# Patient Record
Sex: Female | Born: 1970 | Race: White | Hispanic: No | Marital: Single | Smoking: Never smoker
Health system: Southern US, Community
[De-identification: ages and names within clinical notes are randomized; demographics above are authoritative.]

## PROBLEM LIST (undated history)

## (undated) DIAGNOSIS — I10 Essential (primary) hypertension: Secondary | ICD-10-CM

## (undated) DIAGNOSIS — F419 Anxiety disorder, unspecified: Secondary | ICD-10-CM

---

## 2016-09-22 HISTORY — PX: FOOT SURGERY: SHX648

## 2017-01-13 DIAGNOSIS — S92325A Nondisplaced fracture of second metatarsal bone, left foot, initial encounter for closed fracture: Secondary | ICD-10-CM | POA: Insufficient documentation

## 2017-02-12 DIAGNOSIS — M85872 Other specified disorders of bone density and structure, left ankle and foot: Secondary | ICD-10-CM | POA: Diagnosis not present

## 2017-02-12 DIAGNOSIS — S92212D Displaced fracture of cuboid bone of left foot, subsequent encounter for fracture with routine healing: Secondary | ICD-10-CM | POA: Diagnosis not present

## 2017-02-12 DIAGNOSIS — S92302D Fracture of unspecified metatarsal bone(s), left foot, subsequent encounter for fracture with routine healing: Secondary | ICD-10-CM | POA: Diagnosis not present

## 2017-02-12 DIAGNOSIS — M79672 Pain in left foot: Secondary | ICD-10-CM | POA: Diagnosis not present

## 2017-02-25 DIAGNOSIS — G8929 Other chronic pain: Secondary | ICD-10-CM | POA: Diagnosis not present

## 2017-02-25 DIAGNOSIS — M79672 Pain in left foot: Secondary | ICD-10-CM | POA: Diagnosis not present

## 2017-03-03 DIAGNOSIS — S92325D Nondisplaced fracture of second metatarsal bone, left foot, subsequent encounter for fracture with routine healing: Secondary | ICD-10-CM | POA: Diagnosis not present

## 2017-03-30 DIAGNOSIS — G8929 Other chronic pain: Secondary | ICD-10-CM | POA: Diagnosis not present

## 2017-03-30 DIAGNOSIS — M79672 Pain in left foot: Secondary | ICD-10-CM | POA: Diagnosis not present

## 2017-04-07 ENCOUNTER — Telehealth: Payer: Self-pay | Admitting: *Deleted

## 2017-04-07 NOTE — Telephone Encounter (Signed)
Pt states she has been treated at [facility] for a fractured foot for 4 months, and would like to see another doctor for other options, PT is not helping.

## 2017-04-28 ENCOUNTER — Ambulatory Visit: Payer: Self-pay | Admitting: Sports Medicine

## 2017-07-22 DIAGNOSIS — Z23 Encounter for immunization: Secondary | ICD-10-CM | POA: Diagnosis not present

## 2017-07-22 DIAGNOSIS — I10 Essential (primary) hypertension: Secondary | ICD-10-CM | POA: Diagnosis not present

## 2017-07-22 DIAGNOSIS — Z1339 Encounter for screening examination for other mental health and behavioral disorders: Secondary | ICD-10-CM | POA: Diagnosis not present

## 2017-07-22 DIAGNOSIS — Z1331 Encounter for screening for depression: Secondary | ICD-10-CM | POA: Diagnosis not present

## 2017-11-04 DIAGNOSIS — S92009A Unspecified fracture of unspecified calcaneus, initial encounter for closed fracture: Secondary | ICD-10-CM | POA: Insufficient documentation

## 2018-03-24 ENCOUNTER — Other Ambulatory Visit: Payer: Self-pay | Admitting: Orthopedic Surgery

## 2018-04-26 ENCOUNTER — Encounter (HOSPITAL_BASED_OUTPATIENT_CLINIC_OR_DEPARTMENT_OTHER): Payer: Self-pay | Admitting: *Deleted

## 2018-04-26 ENCOUNTER — Other Ambulatory Visit: Payer: Self-pay

## 2018-04-29 ENCOUNTER — Ambulatory Visit (HOSPITAL_BASED_OUTPATIENT_CLINIC_OR_DEPARTMENT_OTHER): Payer: No Typology Code available for payment source | Admitting: Anesthesiology

## 2018-04-29 ENCOUNTER — Ambulatory Visit (HOSPITAL_BASED_OUTPATIENT_CLINIC_OR_DEPARTMENT_OTHER)
Admission: RE | Admit: 2018-04-29 | Discharge: 2018-04-29 | Disposition: A | Discharge status: Home / Self Care | Payer: No Typology Code available for payment source | Source: Ambulatory Visit | Attending: Orthopedic Surgery | Admitting: Orthopedic Surgery

## 2018-04-29 ENCOUNTER — Other Ambulatory Visit: Payer: Self-pay

## 2018-04-29 ENCOUNTER — Encounter (HOSPITAL_BASED_OUTPATIENT_CLINIC_OR_DEPARTMENT_OTHER)
Admission: RE | Disposition: A | Discharge status: Home / Self Care | Payer: Self-pay | Source: Ambulatory Visit | Attending: Orthopedic Surgery

## 2018-04-29 ENCOUNTER — Encounter (HOSPITAL_BASED_OUTPATIENT_CLINIC_OR_DEPARTMENT_OTHER): Payer: Self-pay

## 2018-04-29 DIAGNOSIS — Z79899 Other long term (current) drug therapy: Secondary | ICD-10-CM | POA: Diagnosis not present

## 2018-04-29 DIAGNOSIS — Y831 Surgical operation with implant of artificial internal device as the cause of abnormal reaction of the patient, or of later complication, without mention of misadventure at the time of the procedure: Secondary | ICD-10-CM | POA: Insufficient documentation

## 2018-04-29 DIAGNOSIS — T8484XA Pain due to internal orthopedic prosthetic devices, implants and grafts, initial encounter: Secondary | ICD-10-CM | POA: Insufficient documentation

## 2018-04-29 DIAGNOSIS — F419 Anxiety disorder, unspecified: Secondary | ICD-10-CM | POA: Insufficient documentation

## 2018-04-29 DIAGNOSIS — I10 Essential (primary) hypertension: Secondary | ICD-10-CM | POA: Insufficient documentation

## 2018-04-29 HISTORY — PX: HARDWARE REMOVAL: SHX979

## 2018-04-29 HISTORY — DX: Anxiety disorder, unspecified: F41.9

## 2018-04-29 HISTORY — DX: Essential (primary) hypertension: I10

## 2018-04-29 SURGERY — REMOVAL, HARDWARE
Anesthesia: Regional | Site: Foot | Laterality: Left

## 2018-04-29 MED ORDER — BUPIVACAINE-EPINEPHRINE (PF) 0.5% -1:200000 IJ SOLN
INTRAMUSCULAR | Status: DC | PRN
  Administered 2018-04-29: 26 mL via PERINEURAL

## 2018-04-29 MED ORDER — SODIUM CHLORIDE 0.9 % IJ SOLN
INTRAMUSCULAR | Status: AC
  Filled 2018-04-29: qty 20

## 2018-04-29 MED ORDER — ONDANSETRON HCL 4 MG/2ML IJ SOLN
INTRAMUSCULAR | Status: AC
  Filled 2018-04-29: qty 2

## 2018-04-29 MED ORDER — PROPOFOL 10 MG/ML IV BOLUS
INTRAVENOUS | Status: DC | PRN
  Administered 2018-04-29: 150 mg via INTRAVENOUS
  Administered 2018-04-29: 50 mg via INTRAVENOUS

## 2018-04-29 MED ORDER — PROPOFOL 10 MG/ML IV BOLUS
INTRAVENOUS | Status: AC
  Filled 2018-04-29: qty 20

## 2018-04-29 MED ORDER — MIDAZOLAM HCL 2 MG/2ML IJ SOLN
INTRAMUSCULAR | Status: AC
  Filled 2018-04-29: qty 2

## 2018-04-29 MED ORDER — ACETAMINOPHEN 160 MG/5ML PO SOLN: 325.0000 mg | ORAL | Status: DC | PRN

## 2018-04-29 MED ORDER — DOCUSATE SODIUM 100 MG PO CAPS: 100.0000 mg | ORAL_CAPSULE | Freq: Two times a day (BID) | ORAL | 0 refills | Status: DC

## 2018-04-29 MED ORDER — MIDAZOLAM HCL 2 MG/2ML IJ SOLN
INTRAMUSCULAR | Status: AC
Start: 2018-04-29 — End: ?
  Filled 2018-04-29: qty 2

## 2018-04-29 MED ORDER — ONDANSETRON HCL 4 MG/2ML IJ SOLN
INTRAMUSCULAR | Status: DC | PRN
  Administered 2018-04-29: 4 mg via INTRAVENOUS

## 2018-04-29 MED ORDER — FENTANYL CITRATE (PF) 100 MCG/2ML IJ SOLN
INTRAMUSCULAR | Status: AC
  Filled 2018-04-29: qty 2

## 2018-04-29 MED ORDER — OXYCODONE HCL 5 MG PO TABS: 5.0000 mg | ORAL_TABLET | Freq: Once | ORAL | Status: DC | PRN

## 2018-04-29 MED ORDER — CEFAZOLIN SODIUM-DEXTROSE 2-4 GM/100ML-% IV SOLN
INTRAVENOUS | Status: AC
  Filled 2018-04-29: qty 100

## 2018-04-29 MED ORDER — SODIUM CHLORIDE 0.9 % IV SOLN: INTRAVENOUS | Status: DC

## 2018-04-29 MED ORDER — DEXAMETHASONE SODIUM PHOSPHATE 10 MG/ML IJ SOLN
INTRAMUSCULAR | Status: AC
  Filled 2018-04-29: qty 1

## 2018-04-29 MED ORDER — CEFAZOLIN SODIUM-DEXTROSE 2-4 GM/100ML-% IV SOLN
2.0000 g | INTRAVENOUS | Status: AC
  Administered 2018-04-29: 2 g via INTRAVENOUS

## 2018-04-29 MED ORDER — BUPIVACAINE LIPOSOME 1.3 % IJ SUSP
INTRAMUSCULAR | Status: DC | PRN
  Administered 2018-04-29: 13 mL

## 2018-04-29 MED ORDER — LACTATED RINGERS IV SOLN
INTRAVENOUS | Status: DC
  Administered 2018-04-29: 08:00:00 via INTRAVENOUS

## 2018-04-29 MED ORDER — 0.9 % SODIUM CHLORIDE (POUR BTL) OPTIME
TOPICAL | Status: DC | PRN
  Administered 2018-04-29: 400 mL

## 2018-04-29 MED ORDER — DEXAMETHASONE SODIUM PHOSPHATE 4 MG/ML IJ SOLN
INTRAMUSCULAR | Status: DC | PRN
  Administered 2018-04-29: 10 mg via INTRAVENOUS

## 2018-04-29 MED ORDER — CLONIDINE HCL (ANALGESIA) 100 MCG/ML EP SOLN
EPIDURAL | Status: DC | PRN
  Administered 2018-04-29: 80 ug

## 2018-04-29 MED ORDER — OXYCODONE HCL 5 MG PO TABS: 5.0000 mg | ORAL_TABLET | ORAL | 0 refills | Status: DC | PRN

## 2018-04-29 MED ORDER — FENTANYL CITRATE (PF) 100 MCG/2ML IJ SOLN: 25.0000 ug | INTRAMUSCULAR | Status: DC | PRN

## 2018-04-29 MED ORDER — LIDOCAINE 2% (20 MG/ML) 5 ML SYRINGE
INTRAMUSCULAR | Status: AC
  Filled 2018-04-29: qty 5

## 2018-04-29 MED ORDER — SENNA 8.6 MG PO TABS: 2.0000 | ORAL_TABLET | Freq: Two times a day (BID) | ORAL | 0 refills | Status: DC

## 2018-04-29 MED ORDER — FENTANYL CITRATE (PF) 100 MCG/2ML IJ SOLN
50.0000 ug | INTRAMUSCULAR | Status: AC | PRN
  Administered 2018-04-29 (×2): 25 ug via INTRAVENOUS
  Administered 2018-04-29: 50 ug via INTRAVENOUS

## 2018-04-29 MED ORDER — SCOPOLAMINE 1 MG/3DAYS TD PT72: 1.0000 | MEDICATED_PATCH | Freq: Once | TRANSDERMAL | Status: DC | PRN

## 2018-04-29 MED ORDER — CHLORHEXIDINE GLUCONATE 4 % EX LIQD: 60.0000 mL | Freq: Once | CUTANEOUS | Status: DC

## 2018-04-29 MED ORDER — OXYCODONE HCL 5 MG/5ML PO SOLN: 5.0000 mg | Freq: Once | ORAL | Status: DC | PRN

## 2018-04-29 MED ORDER — ACETAMINOPHEN 325 MG PO TABS: 325.0000 mg | ORAL_TABLET | ORAL | Status: DC | PRN

## 2018-04-29 MED ORDER — MIDAZOLAM HCL 2 MG/2ML IJ SOLN
1.0000 mg | INTRAMUSCULAR | Status: DC | PRN
  Administered 2018-04-29 (×2): 2 mg via INTRAVENOUS

## 2018-04-29 MED ORDER — BUPIVACAINE LIPOSOME 1.3 % IJ SUSP
INTRAMUSCULAR | Status: AC
  Filled 2018-04-29: qty 20

## 2018-04-29 SURGICAL SUPPLY — 65 items
BANDAGE ACE 4X5 VEL STRL LF (GAUZE/BANDAGES/DRESSINGS) IMPLANT
BANDAGE ESMARK 6X9 LF (GAUZE/BANDAGES/DRESSINGS) IMPLANT
BENZOIN TINCTURE PRP APPL 2/3 (GAUZE/BANDAGES/DRESSINGS) IMPLANT
BLADE SURG 15 STRL LF DISP TIS (BLADE) ×3 IMPLANT
BLADE SURG 15 STRL SS (BLADE) ×6
BNDG COHESIVE 4X5 TAN STRL (GAUZE/BANDAGES/DRESSINGS) IMPLANT
BNDG COHESIVE 6X5 TAN STRL LF (GAUZE/BANDAGES/DRESSINGS) IMPLANT
BNDG ESMARK 4X9 LF (GAUZE/BANDAGES/DRESSINGS) IMPLANT
BNDG ESMARK 6X9 LF (GAUZE/BANDAGES/DRESSINGS)
CHLORAPREP W/TINT 26ML (MISCELLANEOUS) ×3 IMPLANT
CLOSURE WOUND 1/2 X4 (GAUZE/BANDAGES/DRESSINGS)
COVER BACK TABLE 60X90IN (DRAPES) ×3 IMPLANT
CUFF TOURNIQUET SINGLE 34IN LL (TOURNIQUET CUFF) IMPLANT
DECANTER SPIKE VIAL GLASS SM (MISCELLANEOUS) IMPLANT
DRAPE EXTREMITY T 121X128X90 (DRAPE) ×3 IMPLANT
DRAPE OEC MINIVIEW 54X84 (DRAPES) ×3 IMPLANT
DRAPE SURG 17X23 STRL (DRAPES) IMPLANT
DRAPE U-SHAPE 47X51 STRL (DRAPES) ×3 IMPLANT
DRSG MEPITEL 4X7.2 (GAUZE/BANDAGES/DRESSINGS) ×3 IMPLANT
DRSG PAD ABDOMINAL 8X10 ST (GAUZE/BANDAGES/DRESSINGS) ×3 IMPLANT
ELECT REM PT RETURN 9FT ADLT (ELECTROSURGICAL) ×3
ELECTRODE REM PT RTRN 9FT ADLT (ELECTROSURGICAL) ×1 IMPLANT
GAUZE SPONGE 4X4 12PLY STRL (GAUZE/BANDAGES/DRESSINGS) ×3 IMPLANT
GLOVE BIO SURGEON STRL SZ8 (GLOVE) ×3 IMPLANT
GLOVE BIOGEL PI IND STRL 7.0 (GLOVE) ×1 IMPLANT
GLOVE BIOGEL PI IND STRL 7.5 (GLOVE) ×1 IMPLANT
GLOVE BIOGEL PI IND STRL 8 (GLOVE) ×2 IMPLANT
GLOVE BIOGEL PI INDICATOR 7.0 (GLOVE) ×2
GLOVE BIOGEL PI INDICATOR 7.5 (GLOVE) ×2
GLOVE BIOGEL PI INDICATOR 8 (GLOVE) ×4
GLOVE ECLIPSE 6.5 STRL STRAW (GLOVE) ×3 IMPLANT
GLOVE ECLIPSE 8.0 STRL XLNG CF (GLOVE) ×3 IMPLANT
GOWN STRL REUS W/ TWL LRG LVL3 (GOWN DISPOSABLE) ×1 IMPLANT
GOWN STRL REUS W/ TWL XL LVL3 (GOWN DISPOSABLE) ×2 IMPLANT
GOWN STRL REUS W/TWL LRG LVL3 (GOWN DISPOSABLE) ×2
GOWN STRL REUS W/TWL XL LVL3 (GOWN DISPOSABLE) ×4
NEEDLE HYPO 22GX1.5 SAFETY (NEEDLE) IMPLANT
PACK BASIN DAY SURGERY FS (CUSTOM PROCEDURE TRAY) ×3 IMPLANT
PAD CAST 4YDX4 CTTN HI CHSV (CAST SUPPLIES) ×1 IMPLANT
PADDING CAST COTTON 4X4 STRL (CAST SUPPLIES) ×2
PADDING CAST COTTON 6X4 STRL (CAST SUPPLIES) IMPLANT
PENCIL BUTTON HOLSTER BLD 10FT (ELECTRODE) ×3 IMPLANT
SANITIZER HAND PURELL 535ML FO (MISCELLANEOUS) ×3 IMPLANT
SHEET MEDIUM DRAPE 40X70 STRL (DRAPES) ×3 IMPLANT
SLEEVE SCD COMPRESS KNEE MED (MISCELLANEOUS) ×3 IMPLANT
SPLINT FAST PLASTER 5X30 (CAST SUPPLIES)
SPLINT PLASTER CAST FAST 5X30 (CAST SUPPLIES) IMPLANT
SPONGE LAP 18X18 RF (DISPOSABLE) ×3 IMPLANT
STOCKINETTE 6  STRL (DRAPES) ×2
STOCKINETTE 6 STRL (DRAPES) ×1 IMPLANT
STRIP CLOSURE SKIN 1/2X4 (GAUZE/BANDAGES/DRESSINGS) IMPLANT
SUCTION FRAZIER HANDLE 10FR (MISCELLANEOUS)
SUCTION TUBE FRAZIER 10FR DISP (MISCELLANEOUS) IMPLANT
SUT ETHILON 3 0 PS 1 (SUTURE) ×3 IMPLANT
SUT MNCRL AB 3-0 PS2 18 (SUTURE) ×3 IMPLANT
SUT VIC AB 0 SH 27 (SUTURE) IMPLANT
SUT VIC AB 2-0 SH 18 (SUTURE) ×3 IMPLANT
SUT VIC AB 2-0 SH 27 (SUTURE)
SUT VIC AB 2-0 SH 27XBRD (SUTURE) IMPLANT
SYR BULB 3OZ (MISCELLANEOUS) ×3 IMPLANT
SYR CONTROL 10ML LL (SYRINGE) ×3 IMPLANT
TOWEL GREEN STERILE FF (TOWEL DISPOSABLE) ×3 IMPLANT
TUBE CONNECTING 20'X1/4 (TUBING)
TUBE CONNECTING 20X1/4 (TUBING) IMPLANT
UNDERPAD 30X30 (UNDERPADS AND DIAPERS) ×3 IMPLANT

## 2018-04-29 NOTE — Progress Notes (Signed)
Assisted Dr. Moser with left, ultrasound guided, popliteal block. Side rails up, monitors on throughout procedure. See vital signs in flow sheet. Tolerated Procedure well. 

## 2018-04-29 NOTE — Anesthesia Procedure Notes (Signed)
Anesthesia Regional Block: Popliteal block   Pre-Anesthetic Checklist: ,, timeout performed, Correct Patient, Correct Site, Correct Laterality, Correct Procedure, Correct Position, site marked, Risks and benefits discussed,  Surgical consent,  Pre-op evaluation,  At surgeon's request and post-op pain management  Laterality: Lower and Left  Prep: chloraprep       Needles:  Injection technique: Single-shot  Needle Type: Echogenic Stimulator Needle          Additional Needles:   Procedures:,,,, ultrasound used (permanent image in chart),,,,  Narrative:  Start time: 04/29/2018 8:33 AM End time: 04/29/2018 8:37 AM Injection made incrementally with aspirations every 5 mL.  Performed by: Personally  Anesthesiologist: Val EagleMoser, Cambria Osten, MD  Additional Notes: H+P and labs reviewed, risks and benefits discussed with patient, procedure tolerated well without complications

## 2018-04-29 NOTE — H&P (Signed)
Sarah Wiggins is an 47 y.o. female.   Chief Complaint: left foot pain HPI: The patient is a 47 year old female with past medical history significant for a left calcaneus fracture.  She continues to have left foot pain after open treatment of a calcaneus fracture with internal fixation.  She has failed nonoperative treatment to date including activity modification, oral anti-inflammatories and shoewear modification as well as physical therapy.  She presents now for removal of deep implants from the lateral aspect of the left calcaneus.  Past Medical History:  Diagnosis Date  . Anxiety   . Hypertension     Past Surgical History:  Procedure Laterality Date  . FOOT SURGERY  2018    History reviewed. No pertinent family history. Social History:  reports that she has never smoked. She has never used smokeless tobacco. She reports that she drank alcohol. She reports that she does not use drugs.  Allergies: No Known Allergies  Medications Prior to Admission  Medication Sig Dispense Refill  . clonazePAM (KLONOPIN) 2 MG tablet Take 2 mg by mouth daily.    Marland Kitchen. losartan (COZAAR) 50 MG tablet Take 50 mg by mouth.    . zolpidem (AMBIEN) 10 MG tablet Take 10 mg by mouth at bedtime as needed for sleep.    . metaxalone (SKELAXIN) 800 MG tablet Take 800 mg by mouth 3 (three) times daily.      No results found for this or any previous visit (from the past 48 hour(s)). No results found.  ROS no recent fever, chills, nausea, vomiting or changes in her appetite  Blood pressure 120/81, pulse 81, temperature 97.9 F (36.6 C), temperature source Oral, resp. rate 19, height 5\' 4"  (1.626 m), weight 53.8 kg, last menstrual period 04/28/2018, SpO2 100 %. Physical Exam  Well-nourished well-developed woman in no apparent distress.  Alert and oriented x4.  Mood and affect are normal.  Extraocular motions are intact.  Respirations are unlabored.  Gait is normal.  The left foot has a healed surgical incision at the  lateral hindfoot.  Skin is otherwise healthy and intact.  No lymphadenopathy.  Pulses are palpable.  Diminished sensibility light touch in the sural nerve distribution along the inferior corner of the incision.  5 out of 5 strength in plantar flexion, dorsi flexion, inversion and eversion around the ankle.  Decreased range of motion of the subtalar joint and inversion and eversion.  Assessment/Plan Painful hardware left calcaneus - to OR for removal of deep implants.  The risks and benefits of the alternative treatment options have been discussed in detail.  The patient wishes to proceed with surgery and specifically understands risks of bleeding, infection, nerve damage, blood clots, need for additional surgery, amputation and death.   Toni ArthursHEWITT, Mieshia Pepitone, MD 04/29/2018, 8:57 AM

## 2018-04-29 NOTE — Op Note (Signed)
04/29/2018  10:09 AM  PATIENT:  Sarah GreenJamie A Wiggins  47 y.o. female  PRE-OPERATIVE DIAGNOSIS:  Left calcaneus painful hardware  POST-OPERATIVE DIAGNOSIS:  Left calcaneus painful hardware  Procedure(s): 1.  Removal of deep implants left foot 2.  Left foot lateral and Harris heel xrays  SURGEON:  Toni ArthursJohn Tennile Styles, MD  ASSISTANT: Alfredo MartinezJustin Ollis, PA-C  ANESTHESIA:   General, regional  EBL:  minimal   TOURNIQUET:   Total Tourniquet Time Documented: Thigh (Left) - 36 minutes Total: Thigh (Left) - 36 minutes  COMPLICATIONS:  None apparent  DISPOSITION:  Extubated, awake and stable to recovery.  INDICATION FOR PROCEDURE: The patient is a 47 year old woman who is almost a year out now from open treatment of her left calcaneus fracture with internal fixation.  She complains of pain at the lateral hindfoot.  She has retained hardware in this area.  She has failed nonoperative treatment to date including activity modification, oral anti-inflammatories, shoewear modification and extensive physical therapy.  She presents now for removal of deep implants from the left calcaneus.  The risks and benefits of the alternative treatment options have been discussed in detail.  The patient wishes to proceed with surgery and specifically understands risks of bleeding, infection, nerve damage, blood clots, need for additional surgery, amputation and death.  PROCEDURE IN DETAIL:  After pre operative consent was obtained, and the correct operative site was identified, the patient was brought to the operating room and placed supine on the OR table.  Anesthesia was administered.  Pre-operative antibiotics were administered.  A surgical timeout was taken.  The left lower extremity was prepped and draped in standard sterile fashion with a tourniquet around the thigh.  The extremity was elevated and the tourniquet was inflated to 250 mmHg.  Patient's previous lateral hindfoot incision was identified.  The incision was opened  again sharply and dissection was carried down through the subcutaneous tissues to the level of the plate.  A full-thickness lateral flap was then elevated from the lateral wall the calcaneus exposing the plate appropriately.  The visualized portions of the peroneal tendons appeared healthy.  The screws around the perimeter of the plate were removed from the level of the posterior facet around to the plantar aspect of the tuberosity.  The anterior inferior screw was removed through the lateral incision.  A small stab incision was made to access the anterior superior screw.  This was removed without difficulty.  The plate was then elevated from the lateral wall of the calcaneus and removed in its entirety.  Lateral and Harris heel radiographs showed appropriate removal of all metallic hardware in appropriate alignment of the calcaneus.  The fractures appear radiographically healed.  The wound was then irrigated copiously.  Inverted simple sutures of 2-0 Vicryl were used to close the deep subtenons tissues.  Algower-Donati sutures were used to close the skin incision.  20 cc of Exparel were infiltrated into the subcutaneous tissues around the incision.  Sterile dressings were applied followed by a well-padded compression wrap.  Tourniquet was released after application of the dressings.  The patient was awakened from anesthesia and transported to the recovery room in stable condition.   FOLLOW UP PLAN: The patient may bear weight as tolerated in the cam boot.  Follow-up with me in the office in 2 weeks for suture removal.  Aleve, extra strength Tylenol and oxycodone for pain control.   RADIOGRAPHS: Lateral and Harris heel radiographs of the left foot are obtained intraoperatively.  These show interval  removal of all metallic hardware from the left calcaneus.  Fractures appear radiographically healed.  Alignment of the calcaneus is appropriate.    Alfredo Martinez PA-C was present and scrubbed for the duration  of the operative case. His assistance was essential in positioning the patient, prepping and draping, gaining and maintaining exposure, performing the operation, closing and dressing the wounds and applying the splint.

## 2018-04-29 NOTE — Anesthesia Procedure Notes (Signed)
Procedure Name: LMA Insertion Date/Time: 04/29/2018 9:11 AM Performed by: Gar GibbonKeeton, Efrat Zuidema S, CRNA Pre-anesthesia Checklist: Patient identified, Emergency Drugs available, Suction available and Patient being monitored Patient Re-evaluated:Patient Re-evaluated prior to induction Oxygen Delivery Method: Circle system utilized Preoxygenation: Pre-oxygenation with 100% oxygen Induction Type: IV induction Ventilation: Mask ventilation without difficulty LMA: LMA inserted LMA Size: 4.0 Number of attempts: 1 Airway Equipment and Method: Bite block Placement Confirmation: positive ETCO2 Tube secured with: Tape Dental Injury: Teeth and Oropharynx as per pre-operative assessment

## 2018-04-29 NOTE — Discharge Instructions (Signed)
Sarah ArthursJohn Hewitt, MD [facility]  Please read the following information regarding your care after surgery.  Medications  You only need a prescription for the narcotic pain medicine (ex. oxycodone, Percocet, Norco).  All of the other medicines listed below are available over the counter. X Aleve 2 pills twice a day for the first 3 days after surgery. X acetominophen (Tylenol) 650 mg every 4-6 hours as you need for minor to moderate pain X oxycodone as prescribed for severe pain  Narcotic pain medicine (ex. oxycodone, Percocet, Vicodin) will cause constipation.  To prevent this problem, take the following medicines while you are taking any pain medicine. X docusate sodium (Colace) 100 mg twice a day X senna (Senokot) 2 tablets twice a day  Weight Bearing X Bear weight only on your operated foot in the CAM boot.  Cast / Splint / Dressing X Keep your splint, cast or dressing clean and dry.  Dont put anything (coat hanger, pencil, etc) down inside of it.  If it gets damp, use a hair dryer on the cool setting to dry it.  If it gets soaked, call the office to schedule an appointment for a cast change.  Do not remove the CAM boot.   After your dressing, cast or splint is removed; you may shower, but do not soak or scrub the wound.  Allow the water to run over it, and then gently pat it dry.  Swelling It is normal for you to have swelling where you had surgery.  To reduce swelling and pain, keep your toes above your nose for at least 3 days after surgery.  It may be necessary to keep your foot or leg elevated for several weeks.  If it hurts, it should be elevated.  Follow Up Call my office at [phone] when you are discharged from the hospital or surgery center to schedule an appointment to be seen two weeks after surgery.  Call my office at [phone] if you develop a fever >101.5 F, nausea, vomiting, bleeding from the surgical site or severe pain.

## 2018-04-29 NOTE — Anesthesia Preprocedure Evaluation (Signed)
Anesthesia Evaluation  Patient identified by MRN, date of birth, ID band Patient awake    Reviewed: Allergy & Precautions, NPO status , Patient's Chart, lab work & pertinent test results  History of Anesthesia Complications Negative for: history of anesthetic complications  Airway Mallampati: II  TM Distance: >3 FB Neck ROM: Full    Dental  (+) Teeth Intact   Pulmonary neg pulmonary ROS,    breath sounds clear to auscultation       Cardiovascular hypertension, Pt. on medications  Rhythm:Regular     Neuro/Psych PSYCHIATRIC DISORDERS Anxiety negative neurological ROS     GI/Hepatic negative GI ROS, Neg liver ROS,   Endo/Other  negative endocrine ROS  Renal/GU negative Renal ROS     Musculoskeletal   Abdominal   Peds  Hematology negative hematology ROS (+)   Anesthesia Other Findings   Reproductive/Obstetrics IUD                             Anesthesia Physical Anesthesia Plan  ASA: II  Anesthesia Plan: General and Regional   Post-op Pain Management:  Regional for Post-op pain   Induction: Intravenous  PONV Risk Score and Plan: 3 and Ondansetron and Dexamethasone  Airway Management Planned: LMA  Additional Equipment: None  Intra-op Plan:   Post-operative Plan: Extubation in OR  Informed Consent: I have reviewed the patients History and Physical, chart, labs and discussed the procedure including the risks, benefits and alternatives for the proposed anesthesia with the patient or authorized representative who has indicated his/her understanding and acceptance.   Dental advisory given  Plan Discussed with: CRNA and Surgeon  Anesthesia Plan Comments:         Anesthesia Quick Evaluation

## 2018-04-29 NOTE — Transfer of Care (Signed)
Immediate Anesthesia Transfer of Care Note  Patient: Sarah Wiggins  Procedure(s) Performed: Removal of deep implants left foot (Left Foot)  Patient Location: PACU  Anesthesia Type:GA combined with regional for post-op pain  Level of Consciousness: sedated and responds to stimulation  Airway & Oxygen Therapy: Patient Spontanous Breathing and Patient connected to face mask oxygen  Post-op Assessment: Report given to RN and Post -op Vital signs reviewed and stable  Post vital signs: Reviewed and stable  Last Vitals:  Vitals Value Taken Time  BP 90/54 04/29/2018 10:07 AM  Temp    Pulse 77 04/29/2018 10:08 AM  Resp 10 04/29/2018 10:08 AM  SpO2 100 % 04/29/2018 10:08 AM  Vitals shown include unvalidated device data.  Last Pain:  Vitals:   04/29/18 0751  TempSrc: Oral         Complications: No apparent anesthesia complications

## 2018-04-30 ENCOUNTER — Encounter (HOSPITAL_BASED_OUTPATIENT_CLINIC_OR_DEPARTMENT_OTHER): Payer: Self-pay | Admitting: Orthopedic Surgery

## 2018-04-30 NOTE — Anesthesia Postprocedure Evaluation (Signed)
Anesthesia Post Note  Patient: Sarah Wiggins  Procedure(s) Performed: Removal of deep implants left foot (Left Foot)     Patient location during evaluation: PACU Anesthesia Type: Regional and General Level of consciousness: awake and alert Pain management: pain level controlled Vital Signs Assessment: post-procedure vital signs reviewed and stable Respiratory status: spontaneous breathing, nonlabored ventilation, respiratory function stable and patient connected to nasal cannula oxygen Cardiovascular status: blood pressure returned to baseline and stable Postop Assessment: no apparent nausea or vomiting Anesthetic complications: no    Last Vitals:  Vitals:   04/29/18 1100 04/29/18 1105  BP:  130/87  Pulse: 65 72  Resp: 12 12  Temp:  36.6 C  SpO2: 100% 100%    Last Pain:  Vitals:   04/29/18 1105  TempSrc: Oral  PainSc: 0-No pain                 Jules Vidovich

## 2018-09-02 DIAGNOSIS — H02419 Mechanical ptosis of unspecified eyelid: Secondary | ICD-10-CM | POA: Insufficient documentation

## 2018-09-02 DIAGNOSIS — H5340 Unspecified visual field defects: Secondary | ICD-10-CM | POA: Insufficient documentation

## 2018-11-08 DIAGNOSIS — Z111 Encounter for screening for respiratory tuberculosis: Secondary | ICD-10-CM | POA: Diagnosis not present

## 2018-11-08 DIAGNOSIS — Z0184 Encounter for antibody response examination: Secondary | ICD-10-CM | POA: Diagnosis not present

## 2018-11-19 DIAGNOSIS — Z111 Encounter for screening for respiratory tuberculosis: Secondary | ICD-10-CM | POA: Diagnosis not present

## 2019-02-01 DIAGNOSIS — Z01818 Encounter for other preprocedural examination: Secondary | ICD-10-CM | POA: Diagnosis not present

## 2019-02-01 DIAGNOSIS — Z1159 Encounter for screening for other viral diseases: Secondary | ICD-10-CM | POA: Diagnosis not present

## 2019-02-04 DIAGNOSIS — H53453 Other localized visual field defect, bilateral: Secondary | ICD-10-CM | POA: Diagnosis not present

## 2019-02-04 DIAGNOSIS — H02411 Mechanical ptosis of right eyelid: Secondary | ICD-10-CM | POA: Diagnosis not present

## 2019-02-04 DIAGNOSIS — H53451 Other localized visual field defect, right eye: Secondary | ICD-10-CM | POA: Diagnosis not present

## 2019-02-04 DIAGNOSIS — Z79899 Other long term (current) drug therapy: Secondary | ICD-10-CM | POA: Diagnosis not present

## 2019-02-04 DIAGNOSIS — I10 Essential (primary) hypertension: Secondary | ICD-10-CM | POA: Diagnosis not present

## 2019-02-04 DIAGNOSIS — H02413 Mechanical ptosis of bilateral eyelids: Secondary | ICD-10-CM | POA: Diagnosis not present

## 2019-02-04 DIAGNOSIS — F419 Anxiety disorder, unspecified: Secondary | ICD-10-CM | POA: Diagnosis not present

## 2019-03-21 DIAGNOSIS — G4700 Insomnia, unspecified: Secondary | ICD-10-CM | POA: Diagnosis not present

## 2019-03-21 DIAGNOSIS — I10 Essential (primary) hypertension: Secondary | ICD-10-CM | POA: Diagnosis not present

## 2019-03-21 DIAGNOSIS — M62838 Other muscle spasm: Secondary | ICD-10-CM | POA: Diagnosis not present

## 2019-03-21 DIAGNOSIS — F419 Anxiety disorder, unspecified: Secondary | ICD-10-CM | POA: Diagnosis not present

## 2019-04-05 DIAGNOSIS — H04129 Dry eye syndrome of unspecified lacrimal gland: Secondary | ICD-10-CM | POA: Insufficient documentation

## 2019-07-26 ENCOUNTER — Ambulatory Visit: Payer: Self-pay

## 2019-07-26 ENCOUNTER — Encounter: Payer: Self-pay | Admitting: Family Medicine

## 2019-07-26 ENCOUNTER — Ambulatory Visit (INDEPENDENT_AMBULATORY_CARE_PROVIDER_SITE_OTHER): Payer: BC Managed Care – PPO | Admitting: Family Medicine

## 2019-07-26 ENCOUNTER — Other Ambulatory Visit: Payer: Self-pay

## 2019-07-26 DIAGNOSIS — M79672 Pain in left foot: Secondary | ICD-10-CM | POA: Diagnosis not present

## 2019-07-26 NOTE — Progress Notes (Signed)
Office Visit Note   Patient: Sarah Wiggins           Date of Birth: 12-Aug-1971           MRN: [id number] Visit Date: 07/26/2019 Requested by: [facility], [address] [place],  [place] [address] PCP: [facility]  Subjective: Chief Complaint  Patient presents with  . Left Foot - Pain    HPI: She is here with left foot pain.  She is seen at the request of Kym Groom.  About 2 years ago she fell off a tanker and fractured both calcaneus bones.  The right when healed without surgery but the left one required ORIF per Dr. Doran Durand.  1 year later hardware was removed.  She has continued to have pain near the lateral malleolus and also near the cuboid.  She has been doing physical therapy with dry needling.  She gets temporary improvement but cannot seem to regain all of her range of motion and she continues to have pain on the lateral foot when walking longer distances.  Prior to fracturing her calcaneus bones she broke her left foot metatarsal bones falling off a bale of hay.  She was having some pain near the cuboid bone even at that time.  She had a DEXA bone density test recently which was normal.               ROS: No fever or chills.  All other systems were reviewed and are negative.  Objective: Vital Signs: There were no vitals taken for this visit.  Physical Exam:  General:  Alert and oriented, in no acute distress. Pulm:  Breathing unlabored. Psy:  Normal mood, congruent affect. Skin:  No rash.  Left foot:  Decreased dorsiflexion at the ankle compared to right.  Mild calf atrophy.  No subluxation of peroneals.  Tender at 5th TMT joint and over dorsal/lateral cuboid.  Imaging: MSK-US:  Peroneals are intact.  Slight fluid in sheath.  At 5th TMT, there appears to be either a bone spur related to DJD, or possibly an avulsion fragment.   Assessment & Plan: 1.  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  Cannot rule out fracture  nonunion. - Will order CT to evaluate. - Depending on results, possibly prolotherapy with dextrose.     Procedures: No procedures performed  No notes on file     PMFS History: Patient Active Problem List   Diagnosis Date Noted  . Tear film insufficiency 04/05/2019  . 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 Closed fracture of calcaneus 11/04/2017  . Closed nondisplaced fracture of second metatarsal bone of left foot 01/13/2017   Past Medical History:  Diagnosis Date  . Anxiety   . Hypertension     History reviewed. No pertinent family history.  Past Surgical History:  Procedure Laterality Date  . FOOT SURGERY  2018  . HARDWARE REMOVAL Left 04/29/2018   Procedure: Removal of deep implants left foot;  Surgeon: Wylene Simmer, MD;  Location: [facility];  Service: Orthopedics;  Laterality: Left;   Social History   Occupational History  . Not on file  Tobacco Use  . Smoking status: Never Smoker  . Smokeless tobacco: Never Used  Substance and Sexual Activity  . Alcohol use: Not Currently  . Drug use: Never  . Sexual activity: Not on file

## 2019-08-03 ENCOUNTER — Ambulatory Visit
Admission: RE | Admit: 2019-08-03 | Discharge: 2019-08-03 | Disposition: A | Discharge status: Home / Self Care | Payer: BC Managed Care – PPO | Source: Ambulatory Visit | Attending: Family Medicine | Admitting: Family Medicine

## 2019-08-03 ENCOUNTER — Telehealth: Payer: Self-pay | Admitting: Family Medicine

## 2019-08-03 DIAGNOSIS — M79672 Pain in left foot: Secondary | ICD-10-CM

## 2019-08-03 MED ORDER — MAGNESIUM 400 MG PO CAPS: 400.0000 mg | ORAL_CAPSULE | Freq: Every day | ORAL | 1 refills | Status: AC

## 2019-08-03 MED ORDER — VITAMIN K2 100 MCG PO TABS: 100.0000 ug | ORAL_TABLET | Freq: Every day | ORAL | 3 refills | Status: AC

## 2019-08-03 MED ORDER — VITAMIN D-3 125 MCG (5000 UT) PO TABS: ORAL_TABLET | ORAL | 3 refills | Status: AC

## 2019-08-03 NOTE — Telephone Encounter (Signed)
CT scan shows that the fracture is completely healed.  I do not see a new fracture.  She has mild arthritis on the lateral side of her foot.  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  I recommend vitamin D3, vitamin K2 and magnesium for bone strength.  I called in prescriptions to her pharmacy.  We could try these for 3 to 4 weeks to see if her pain will improve.  If she is not noticing any improvement by then, we could try prolotherapy injection with dextrose.

## 2019-08-04 ENCOUNTER — Telehealth: Payer: Self-pay | Admitting: Family Medicine

## 2019-08-04 NOTE — Telephone Encounter (Signed)
I called and advised the patient of her CT results and plan. She will try these vitamins as directed and call us back in December for an appointment, if necessary.

## 2019-08-04 NOTE — Telephone Encounter (Signed)
Patient called advised she would like to get rhe injection. The number to contact patient is [phone]

## 2019-08-05 NOTE — Telephone Encounter (Signed)
Someone scheduled her for an appointment for this next week already (after this message was taken).

## 2019-08-08 ENCOUNTER — Telehealth: Payer: Self-pay | Admitting: Family Medicine

## 2019-08-08 NOTE — Telephone Encounter (Signed)
Patient called asked if the injection will be covered by her insurance company? Patient asked if she will be able to drive after the injection? Patient said her car has a [employer]. The number to contact patient is [phone]

## 2019-08-08 NOTE — Telephone Encounter (Signed)
Please advise. She is scheduled for prolotherapy injection in the left foot tomorrow.

## 2019-08-08 NOTE — Telephone Encounter (Signed)
Sarah Wiggins,  Is there a way to estimate her cost for a foot injection using CPT 20604?  I won't charge for the dextrose, only for the injection procedure.  Thanks!

## 2019-08-08 NOTE — Telephone Encounter (Signed)
Please see the questions about driving/clutch. 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 What should she expect over the next week after the injection?

## 2019-08-09 ENCOUNTER — Encounter: Payer: Self-pay | Admitting: Family Medicine

## 2019-08-09 ENCOUNTER — Ambulatory Visit (INDEPENDENT_AMBULATORY_CARE_PROVIDER_SITE_OTHER): Payer: BC Managed Care – PPO | Admitting: Family Medicine

## 2019-08-09 ENCOUNTER — Ambulatory Visit: Payer: Self-pay

## 2019-08-09 ENCOUNTER — Other Ambulatory Visit: Payer: Self-pay

## 2019-08-09 DIAGNOSIS — M79672 Pain in left foot: Secondary | ICD-10-CM

## 2019-08-09 NOTE — Telephone Encounter (Signed)
I called and advised the patient of what to expect after the injection. Per Anne Ng in our insurance/billing department, the CPT code given by Dr. Junius Roads is a covered code with [employer].

## 2019-08-09 NOTE — Telephone Encounter (Signed)
Fine to drive, may be sore for a week or so.  Needs to avoid NSAIDs for two weeks.

## 2019-08-09 NOTE — Progress Notes (Signed)
   Office Visit Note   Patient: Sarah Wiggins           Date of Birth: 1971/02/12           MRN: [id number] Visit Date: 08/09/2019 Requested by: [facility], [address] [place],  [place] [address] PCP: [facility]  Subjective: Chief Complaint  Patient presents with  . Left Foot - Pain    prolotherapy injection    HPI: She is here for follow-up left lateral foot pain.  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              ROS: No fevers or chills.  All other systems were reviewed and are negative.  Objective: Vital Signs: There were no vitals taken for this visit.  Physical Exam:  General:  Alert and oriented, in no acute distress. Pulm:  Breathing unlabored. Psy:  Normal mood, congruent affect. Skin: No rash. Left foot: She remains tender on the dorsal aspect of the fifth TMT joint.  Imaging: None today.  Assessment & Plan: 1.  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 -Elected to try prolotherapy with dextrose today.  Follow-up in 2 to 3 weeks for a second injection if needed.     Procedures: Left foot injection: After sterile prep with Betadine, injected a total of 4 cc of a 20% dextrose solution mixed in 1% lidocaine without epinephrine.  Used ultrasound to locate proper proper position.  She had local bleeding afterward which was controlled with pressure.  She had very good immediate pain relief.   PMFS History: Patient Active Problem List   Diagnosis Date Noted  . Tear film insufficiency 04/05/2019  . 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 Closed fracture of calcaneus 11/04/2017  . Closed nondisplaced fracture of second metatarsal bone of left foot 01/13/2017   Past Medical History:  Diagnosis Date  . Anxiety   . Hypertension     History reviewed. No pertinent family history.  Past Surgical History:  Procedure Laterality Date  . FOOT SURGERY  2018  . HARDWARE  REMOVAL Left 04/29/2018   Procedure: Removal of deep implants left foot;  Surgeon: Wylene Simmer, MD;  Location: [facility];  Service: Orthopedics;  Laterality: Left;   Social History   Occupational History  . Not on file  Tobacco Use  . Smoking status: Never Smoker  . Smokeless tobacco: Never Used  Substance and Sexual Activity  . Alcohol use: Not Currently  . Drug use: Never  . Sexual activity: Not on file

## 2019-08-10 ENCOUNTER — Telehealth: Payer: Self-pay | Admitting: Family Medicine

## 2019-08-10 MED ORDER — TRAMADOL HCL 50 MG PO TABS: 50.0000 mg | ORAL_TABLET | Freq: Four times a day (QID) | ORAL | 0 refills | Status: DC | PRN

## 2019-08-10 MED ORDER — HYDROCODONE-ACETAMINOPHEN 5-325 MG PO TABS: 1.0000 | ORAL_TABLET | Freq: Four times a day (QID) | ORAL | 0 refills | Status: DC | PRN

## 2019-08-10 NOTE — Telephone Encounter (Signed)
I called and advised the patient. 

## 2019-08-10 NOTE — Telephone Encounter (Signed)
I do not see anything noted in the chart. Please advise.

## 2019-08-10 NOTE — Telephone Encounter (Signed)
Please advise 

## 2019-08-10 NOTE — Telephone Encounter (Signed)
Patient called left voicemail message asking if Rx for pain was sent to the [facility] in [place]? The number to contact patient is [phone]

## 2019-08-10 NOTE — Telephone Encounter (Signed)
Patient says the wrong pain meds was called in for her. Should be hydrocodone not Tramadol. Her call back number is [phone]

## 2019-08-10 NOTE — Telephone Encounter (Signed)
Sent!

## 2019-08-10 NOTE — Telephone Encounter (Signed)
Rx sent to [facility].  Sorry for the delay.

## 2019-08-10 NOTE — Telephone Encounter (Signed)
I called and advised the patient the Hydrocodone was sent in to her pharmacy. Also, she says they did not receive the Rx for vitamin K2 on 08/03/19 - I recalled this in to them - left on [facility] voice mail.

## 2019-08-24 ENCOUNTER — Ambulatory Visit (INDEPENDENT_AMBULATORY_CARE_PROVIDER_SITE_OTHER): Payer: BC Managed Care – PPO | Admitting: Family Medicine

## 2019-08-24 ENCOUNTER — Other Ambulatory Visit: Payer: Self-pay

## 2019-08-24 ENCOUNTER — Encounter: Payer: Self-pay | Admitting: Family Medicine

## 2019-08-24 DIAGNOSIS — M79672 Pain in left foot: Secondary | ICD-10-CM

## 2019-08-24 NOTE — Progress Notes (Signed)
Subjective: She is here for repeat left foot injection.  She did well after the first 1 at the fifth TMT joint.  She would like another injection there as well as laterally near the subtalar joint and posteriorly at the distal Achilles tendon.  Objective: She is tender at the left fifth TMT joint, near the subtalar joint laterally and the distal Achilles.  Procedure: Prolotherapy injection with dextrose left foot: After sterile prep with Betadine, injected 1/2 cc of 20% dextrose and lidocaine solution into the fifth TMT joint, into the region of the subtalar joint and into the distal Achilles tendon by palpation.  She tolerated this well.  I will see her back in 2 to 3 weeks on an as-needed basis for repeat injection if needed.

## 2019-08-25 ENCOUNTER — Other Ambulatory Visit: Payer: Self-pay | Admitting: Family Medicine

## 2019-08-25 ENCOUNTER — Telehealth: Payer: Self-pay | Admitting: Family Medicine

## 2019-08-25 MED ORDER — HYDROCODONE-ACETAMINOPHEN 5-325 MG PO TABS: 1.0000 | ORAL_TABLET | Freq: Four times a day (QID) | ORAL | 0 refills | Status: AC | PRN

## 2019-08-25 NOTE — Telephone Encounter (Signed)
Patient called asked if the Rx for Hydrocodone can be sent to the [facility] on [address] in [place]    The number to contact patient is [phone]

## 2019-08-25 NOTE — Telephone Encounter (Signed)
Rx sent 

## 2019-08-25 NOTE — Telephone Encounter (Signed)
I called and advised the patient her Hydrocodone Rx has been sent in to her pharmacy.

## 2019-08-25 NOTE — Telephone Encounter (Signed)
Please advise 

## 2020-10-08 ENCOUNTER — Other Ambulatory Visit: Payer: Self-pay | Admitting: Cardiology

## 2020-10-20 IMAGING — CT CT FOOT*L* W/O CM
4 of 7 series · 15 of 35 positions shown, 16 images · non-contrast
Comparison: Left foot x-rays dated June 20, 2019. CT left foot
dated April 17, 2017.

CLINICAL DATA: Chronic foot pain near the fifth TMT joint.

EXAM:
CT OF THE LEFT FOOT WITHOUT CONTRAST
TECHNIQUE: Multidetector CT imaging of the left foot was performed according to
the standard protocol. Multiplanar CT image reconstructions were
also generated.

[Series 1005: cor bone · axial · 0.48mm/px · z∈[+422,+657]mm · 3 of 63 slices shown, 4 images]
[im 1/63  soft-tissue]
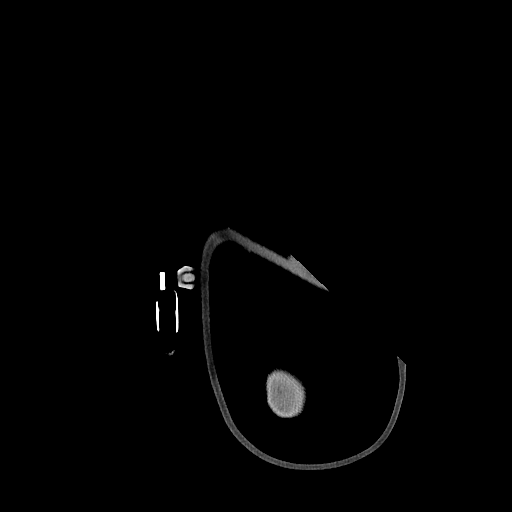
[im 1/63  bone]
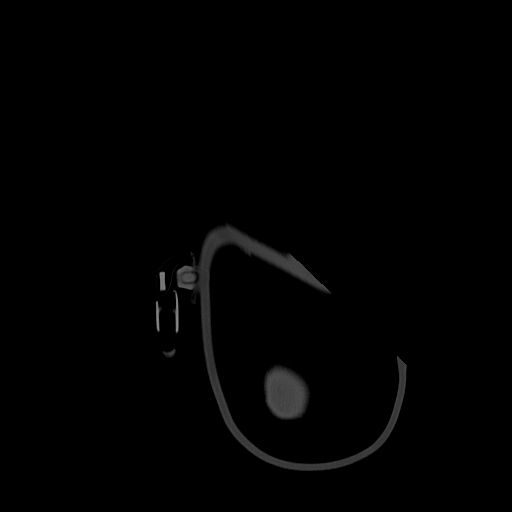
[im 32/63  bone]
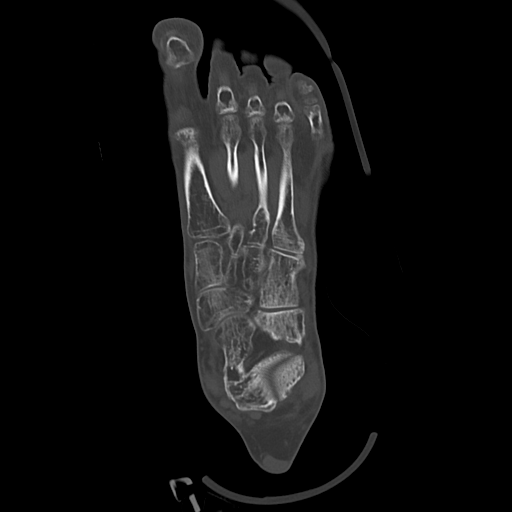
[im 63/63  bone]
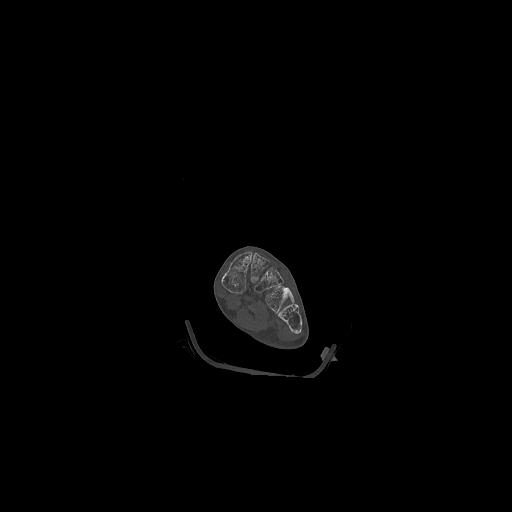

[Series 1009: sag bone · sagittal · 0.48mm/px · 6 of 76 slices shown]
[im 33/76  bone]
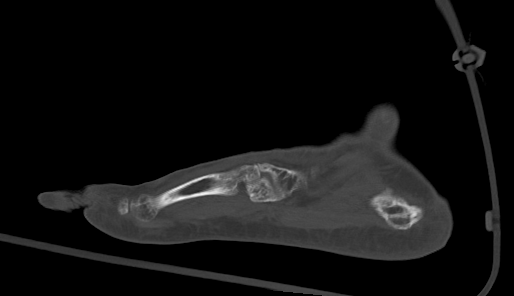
[im 40/76  bone]
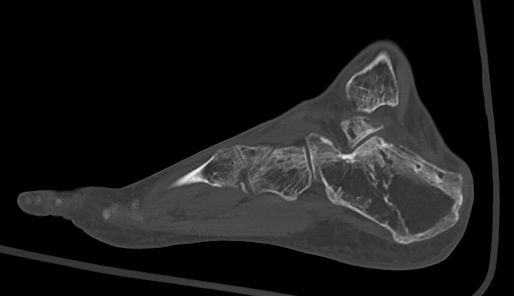
[im 47/76  bone]
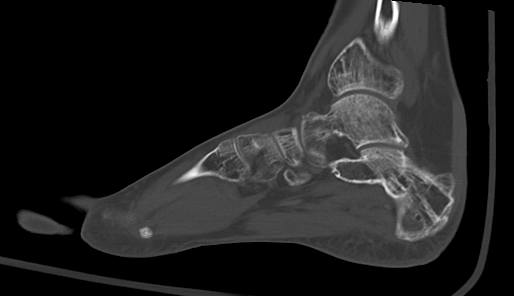
[im 50/76  soft-tissue]
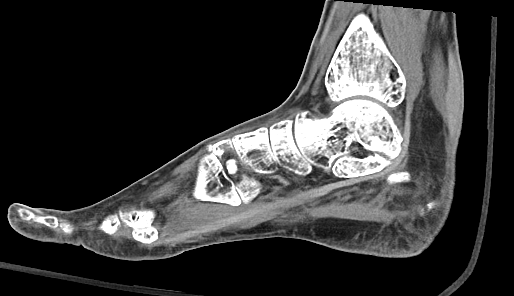
[im 54/76  bone]
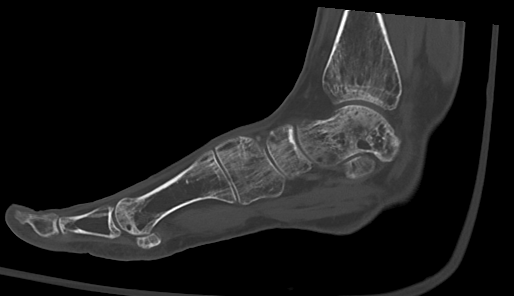
[im 61/76  bone]
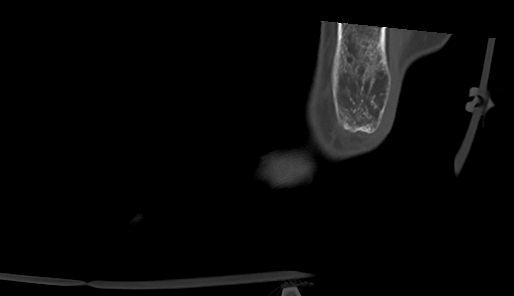

[Series 1013: axial forefoot bone · coronal · 0.48mm/px · 3 of 104 slices shown]
[im 26/104  bone]
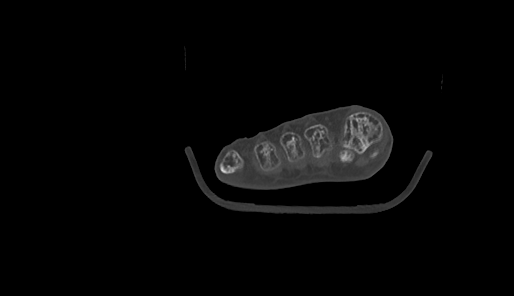
[im 52/104  bone]
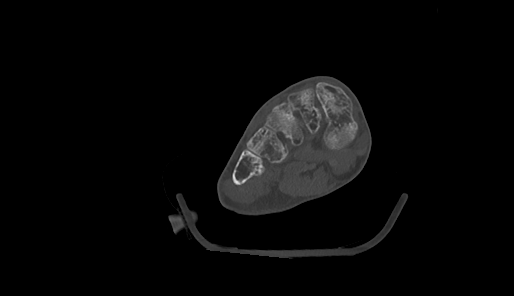
[im 78/104  bone]
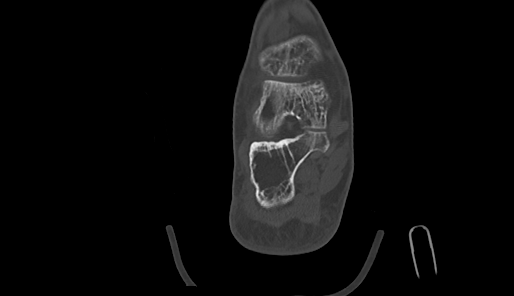

[Series 1025: axial forefoot · coronal · 0.48mm/px · 3 of 114 slices shown]
[im 31/114  bone]
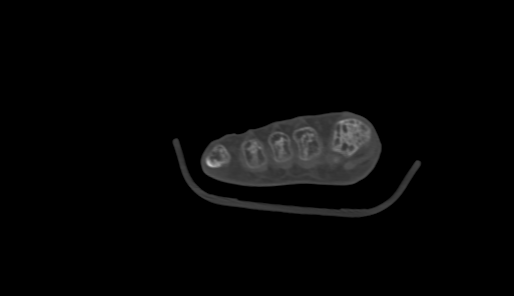
[im 48/114  bone]
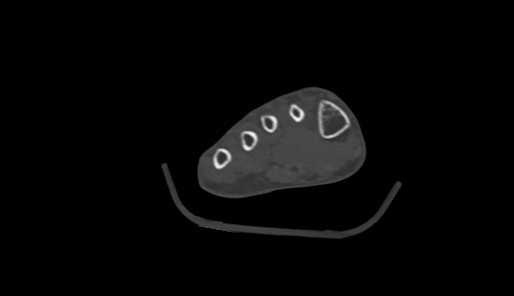
[im 66/114  bone]
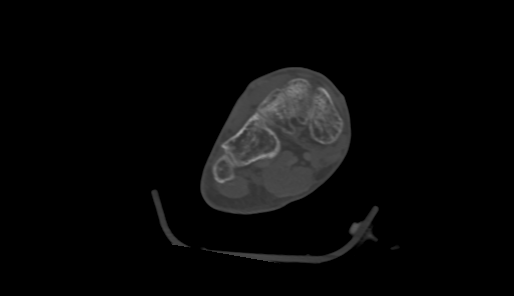

[15 of 35 positions shown; findings below may reference images not displayed]

FINDINGS: Bones/Joint/Cartilage

No acute fracture or dislocation. Healed calcaneus fracture status
post hardware removal. Mild fifth TMT joint space narrowing with
small marginal osteophytes. Remaining joint spaces are relatively
preserved. The ankle mortise is symmetric. The talar dome is intact.
No tibiotalar joint effusion. Severe osteopenia.

Ligaments

Ligaments are suboptimally evaluated by CT.

Muscles and Tendons
Grossly intact.  No muscle atrophy.

Soft tissue
No fluid collection or hematoma.  No soft tissue mass.
IMPRESSION: 1. No acute osseous abnormality.  Healed calcaneus fracture.
2. Mild fifth TMT joint osteoarthritis.
3. Severe osteopenia.
